# Patient Record
Sex: Male | Born: 1970 | Race: Black or African American | Hispanic: No | Marital: Single | Smoking: Never smoker
Health system: Southern US, Community
[De-identification: ages and names within clinical notes are randomized; demographics above are authoritative.]

## PROBLEM LIST (undated history)

## (undated) DIAGNOSIS — N184 Chronic kidney disease, stage 4 (severe): Secondary | ICD-10-CM

## (undated) DIAGNOSIS — I10 Essential (primary) hypertension: Secondary | ICD-10-CM

## (undated) HISTORY — DX: Essential (primary) hypertension: I10

## (undated) HISTORY — PX: OTHER SURGICAL HISTORY: SHX169

## (undated) HISTORY — DX: Chronic kidney disease, stage 4 (severe): N18.4

---

## 2002-08-14 ENCOUNTER — Emergency Department (HOSPITAL_COMMUNITY): Admission: EM | Admit: 2002-08-14 | Discharge: 2002-08-14 | Payer: Self-pay | Admitting: Emergency Medicine

## 2002-08-14 ENCOUNTER — Encounter: Payer: Self-pay | Admitting: Emergency Medicine

## 2006-04-15 ENCOUNTER — Ambulatory Visit: Payer: Self-pay | Admitting: Nephrology

## 2006-07-16 ENCOUNTER — Emergency Department (HOSPITAL_COMMUNITY): Admission: EM | Admit: 2006-07-16 | Discharge: 2006-07-16 | Payer: Self-pay | Admitting: Emergency Medicine

## 2007-02-03 ENCOUNTER — Emergency Department (HOSPITAL_COMMUNITY): Admission: EM | Admit: 2007-02-03 | Discharge: 2007-02-03 | Payer: Self-pay | Admitting: Family Medicine
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
## 2011-12-02 DIAGNOSIS — E785 Hyperlipidemia, unspecified: Secondary | ICD-10-CM | POA: Insufficient documentation

## 2011-12-10 DIAGNOSIS — N189 Chronic kidney disease, unspecified: Secondary | ICD-10-CM | POA: Insufficient documentation

## 2011-12-10 DIAGNOSIS — N184 Chronic kidney disease, stage 4 (severe): Secondary | ICD-10-CM | POA: Insufficient documentation

## 2012-02-24 DIAGNOSIS — I129 Hypertensive chronic kidney disease with stage 1 through stage 4 chronic kidney disease, or unspecified chronic kidney disease: Secondary | ICD-10-CM | POA: Insufficient documentation

## 2020-05-26 ENCOUNTER — Other Ambulatory Visit (HOSPITAL_COMMUNITY): Payer: Self-pay | Admitting: Nephrology

## 2020-05-26 DIAGNOSIS — I129 Hypertensive chronic kidney disease with stage 1 through stage 4 chronic kidney disease, or unspecified chronic kidney disease: Secondary | ICD-10-CM

## 2020-06-12 ENCOUNTER — Ambulatory Visit (HOSPITAL_COMMUNITY): Admission: RE | Admit: 2020-06-12 | Payer: Self-pay | Source: Ambulatory Visit

## 2020-07-12 ENCOUNTER — Telehealth (HOSPITAL_COMMUNITY): Payer: Self-pay | Admitting: Nephrology

## 2021-09-10 ENCOUNTER — Encounter: Payer: Self-pay | Admitting: Urology

## 2023-05-23 ENCOUNTER — Encounter: Payer: Self-pay | Admitting: Primary Care

## 2023-05-23 ENCOUNTER — Ambulatory Visit (INDEPENDENT_AMBULATORY_CARE_PROVIDER_SITE_OTHER): Payer: BC Managed Care – PPO | Admitting: Primary Care

## 2023-05-23 VITALS — BP 142/78 | HR 70 | Temp 98.3°F | Ht 68.0 in | Wt 238.2 lb

## 2023-05-23 DIAGNOSIS — R0683 Snoring: Secondary | ICD-10-CM

## 2023-05-23 MED ORDER — FAMOTIDINE 20 MG PO TABS: 20.0000 mg | ORAL_TABLET | Freq: Every day | ORAL | 1 refills | Status: DC

## 2023-05-23 NOTE — Progress Notes (Unsigned)
@Patient  ID: Phillip Odonnell, male    DOB: Feb 04, 1971, 52 y.o.   MRN: [id number]  Chief Complaint  Patient presents with   Consult    Loud snoring x several years.  Reflux with spicy foods or lemonade drink.    Referring provider: Randa Lynn, MD  HPI: PMH hypertensive renal disease, leg swelling   05/23/2023 Patient presents today for sleep consult.  He was referred by nephrologist, Dr Phillip Odonnell, due to persistent hypertension. His wife has told him that he snores loudly. Sleep can be disrupted at times. He wakes up a 2-3 times a night on average to use the restroom. He has reflux at night which has caused him to vomit, this happens more specifically when having lemonade or tomato sauce. He has not tried anything for reflux.   Sleep questionnaire Symptoms- loud snoring, sleep disturbance Prior sleep study- none  Bedtime- 10pm Time to fall asleep- not long Nocturnal awakenings- 2-3 times Out of bed/start of day- 3:35am work days; 9am on weekends Weight changes- 10 lbs  Do you operate heavy machinery- yes Do you currently wear CPAP- no Do you current wear oxygen- no Epworth- 8  Not on File   There is no immunization history on file for this patient.  No past medical history on file.  Tobacco History: Social History   Tobacco Use  Smoking Status Never   Passive exposure: Past  Smokeless Tobacco Never   Counseling given: Not Answered   Outpatient Medications Prior to Visit  Medication Sig Dispense Refill   ALLOPURINOL PO Take 1 tablet by mouth daily. For gout     amLODipine (NORVASC) 10 MG tablet Take 10 mg by mouth daily.     atorvastatin (LIPITOR) 20 MG tablet Take 20 mg by mouth daily.     CVS D3 25 MCG (1000 UT) capsule Take 1,000 Units by mouth daily.     ELDERBERRY PO Take by mouth.     furosemide (LASIX) 20 MG tablet Take 20 mg by mouth 2 (two) times daily.     losartan (COZAAR) 100 MG tablet Take 100 mg by mouth daily.     Multiple  Vitamins-Minerals (MULTIVITAMIN WITH MINERALS) tablet Take 1 tablet by mouth daily.     No facility-administered medications prior to visit.   Review of Systems  Review of Systems  Constitutional:  Positive for fatigue.  HENT: Negative.    Respiratory: Negative.    Cardiovascular: Negative.   Psychiatric/Behavioral:  Positive for sleep disturbance.     Physical Exam  BP (!) 142/78 (BP Location: Right Arm, Patient Position: Sitting, Cuff Size: Large)   Pulse 70   Temp 98.3 F (36.8 C) (Oral)   Ht 5\' 8"  (1.727 m)   Wt 238 lb 3.2 oz (108 kg)   SpO2 99%   BMI 36.22 kg/m  Physical Exam Constitutional:      General: He is not in acute distress.    Appearance: Normal appearance. He is not ill-appearing.  HENT:     Head: Normocephalic and atraumatic.  Cardiovascular:     Rate and Rhythm: Normal rate and regular rhythm.  Pulmonary:     Effort: Pulmonary effort is normal.     Breath sounds: Normal breath sounds.  Musculoskeletal:        General: Normal range of motion.  Skin:    General: Skin is warm and dry.  Neurological:     General: No focal deficit present.     Mental Status: He is alert  and oriented to person, place, and time. Mental status is at baseline.  Psychiatric:        Mood and Affect: Mood normal.        Behavior: Behavior normal.        Thought Content: Thought content normal.        Judgment: Judgment normal.      Lab Results:  CBC No results found for: "WBC", "RBC", "HGB", "HCT", "PLT", "MCV", "MCH", "MCHC", "RDW", "LYMPHSABS", "MONOABS", "EOSABS", "BASOSABS"  BMET No results found for: "NA", "K", "CL", "CO2", "GLUCOSE", "BUN", "CREATININE", "CALCIUM", "GFRNONAA", "GFRAA"  BNP No results found for: "BNP"  ProBNP No results found for: "PROBNP"  Imaging: No results found.   Assessment & Plan:   Loud snoring - Past medical history significant for hypertension, referred by nephrologist.  Patient has symptoms of loud snoring and restless  sleep.  He wakes up on average 2-3 times a night.  Epworth 8.  BMI 36.  Concern patient has underlying obstructive sleep apnea, needs home sleep study evaluate.  We discussed risks of untreated sleep apnea including cardiac arrhythmias, pulmonary hypertension, diabetes and stroke.  We also discussed treatment options including weight loss, oral appliance, CPAP therapy referral to ENT for possible surgical options.  Encourage side sleeping position.  Advised against driving if experiencing excessive daytime sleepiness fatigue.  Follow-up 1 to 2 weeks after sleep study to review results and treatment options if needed.   Acid reflux Try wedge pillow to elevate head while sleep to help to cut down on reflux  Follow reflux diet as best a possible  Start famotidine 20mg  at bedtime   Follow-up: 6 weeks to review sleep study results   Glenford Bayley, NP 05/26/2023

## 2023-05-23 NOTE — Patient Instructions (Addendum)
Recommendations: Try wedge pillow to elevate head while sleep to help to cut down on reflux  Follow reflux diet as best a possible  Start famotidine 20mg  at bedtime   Orders: Home sleep study re: loud snoring  Follow-up: 6 weeks to review sleep study results    Food Choices for Gastroesophageal Reflux Disease, Adult When you have gastroesophageal reflux disease (GERD), the foods you eat and your eating habits are very important. Choosing the right foods can help ease your discomfort. Think about working with a food expert (dietitian) to help you make good choices. What are tips for following this plan? Reading food labels Look for foods that are low in saturated fat. Foods that may help with your symptoms include: Foods that have less than 5% of daily value (DV) of fat. Foods that have 0 grams of trans fat. Cooking Do not fry your food. Cook your food by baking, steaming, grilling, or broiling. These are all methods that do not need a lot of fat for cooking. To add flavor, try to use herbs that are low in spice and acidity. Meal planning  Choose healthy foods that are low in fat, such as: Fruits and vegetables. Whole grains. Low-fat dairy products. Lean meats, fish, and poultry. Eat small meals often instead of eating 3 large meals each day. Eat your meals slowly in a place where you are relaxed. Avoid bending over or lying down until 2-3 hours after eating. Limit high-fat foods such as fatty meats or fried foods. Limit your intake of fatty foods, such as oils, butter, and shortening. Avoid the following as told by your doctor: Foods that cause symptoms. These may be different for different people. Keep a food diary to keep track of foods that cause symptoms. Alcohol. Drinking a lot of liquid with meals. Eating meals during the 2-3 hours before bed. Lifestyle Stay at a healthy weight. Ask your doctor what weight is healthy for you. If you need to lose weight, work with your  doctor to do so safely. Exercise for at least 30 minutes on 5 or more days each week, or as told by your doctor. Wear loose-fitting clothes. Do not smoke or use any products that contain nicotine or tobacco. If you need help quitting, ask your doctor. Sleep with the head of your bed higher than your feet. Use a wedge under the mattress or blocks under the bed frame to raise the head of the bed. Chew sugar-free gum after meals. What foods should eat?  Eat a healthy, well-balanced diet of fruits, vegetables, whole grains, low-fat dairy products, lean meats, fish, and poultry. Each person is different. Foods that may cause symptoms in one person may not cause any symptoms in another person. Work with your doctor to find foods that are safe for you. The items listed above may not be a complete list of what you can eat and drink. Contact a food expert for more options. What foods should I avoid? Limiting some of these foods may help in managing the symptoms of GERD. Everyone is different. Talk with a food expert or your doctor to help you find the exact foods to avoid, if any. Fruits Any fruits prepared with added fat. Any fruits that cause symptoms. For some people, this may include citrus fruits, such as oranges, grapefruit, pineapple, and lemons. Vegetables Deep-fried vegetables. [place] fries. Any vegetables prepared with added fat. Any vegetables that cause symptoms. For some people, this may include tomatoes and tomato products, chili peppers, onions  and garlic, and horseradish. Grains Pastries or quick breads with added fat. Meats and other proteins High-fat meats, such as fatty beef or pork, hot dogs, ribs, ham, sausage, salami, and bacon. Fried meat or protein, including fried fish and fried chicken. Nuts and nut butters, in large amounts. Dairy Whole milk and chocolate milk. Sour cream. Cream. Ice cream. Cream cheese. Milkshakes. Fats and oils Butter. Margarine. Shortening.  Ghee. Beverages Coffee and tea, with or without caffeine. Carbonated beverages. Sodas. Energy drinks. Fruit juice made with acidic fruits, such as orange or grapefruit. Tomato juice. Alcoholic drinks. Sweets and desserts Chocolate and cocoa. Donuts. Seasonings and condiments Pepper. Peppermint and spearmint. Added salt. Any condiments, herbs, or seasonings that cause symptoms. For some people, this may include curry, hot sauce, or vinegar-based salad dressings. The items listed above may not be a complete list of what you should not eat and drink. Contact a food expert for more options. Questions to ask your doctor Diet and lifestyle changes are often the first steps that are taken to manage symptoms of GERD. If diet and lifestyle changes do not help, talk with your doctor about taking medicines. Where to find more information International Foundation for Gastrointestinal Disorders: aboutgerd.org Summary When you have GERD, food and lifestyle choices are very important in easing your symptoms. Eat small meals often instead of 3 large meals a day. Eat your meals slowly and in a place where you are relaxed. Avoid bending over or lying down until 2-3 hours after eating. Limit high-fat foods such as fatty meats or fried foods. This information is not intended to replace advice given to you by your health care provider. Make sure you discuss any questions you have with your health care provider. Document Revised: 05/22/2020 Document Reviewed: 05/22/2020 Elsevier Patient Education  2024 [employer].

## 2023-05-26 DIAGNOSIS — R0683 Snoring: Secondary | ICD-10-CM | POA: Insufficient documentation

## 2023-05-26 DIAGNOSIS — K219 Gastro-esophageal reflux disease without esophagitis: Secondary | ICD-10-CM | POA: Insufficient documentation

## 2023-05-26 NOTE — Assessment & Plan Note (Signed)
-   Past medical history significant for hypertension, referred by nephrologist.  Patient has symptoms of loud snoring and restless sleep.  He wakes up on average 2-3 times a night.  Epworth 8.  BMI 36.  Concern patient has underlying obstructive sleep apnea, needs home sleep study evaluate.  We discussed risks of untreated sleep apnea including cardiac arrhythmias, pulmonary hypertension, diabetes and stroke.  We also discussed treatment options including weight loss, oral appliance, CPAP therapy referral to ENT for possible surgical options.  Encourage side sleeping position.  Advised against driving if experiencing excessive daytime sleepiness fatigue.  Follow-up 1 to 2 weeks after sleep study to review results and treatment options if needed.

## 2023-05-26 NOTE — Assessment & Plan Note (Signed)
Try wedge pillow to elevate head while sleep to help to cut down on reflux  Follow reflux diet as best a possible  Start famotidine 20mg  at bedtime

## 2023-09-04 LAB — LAB REPORT - SCANNED
Creatinine, POC: 71.4 mg/dL
EGFR: 20

## 2023-11-05 ENCOUNTER — Encounter: Payer: BC Managed Care – PPO | Attending: Physician Assistant | Admitting: Nutrition

## 2023-11-05 VITALS — Ht 67.0 in | Wt 231.0 lb

## 2023-11-05 DIAGNOSIS — I129 Hypertensive chronic kidney disease with stage 1 through stage 4 chronic kidney disease, or unspecified chronic kidney disease: Secondary | ICD-10-CM | POA: Insufficient documentation

## 2023-11-05 DIAGNOSIS — E669 Obesity, unspecified: Secondary | ICD-10-CM | POA: Diagnosis present

## 2023-11-05 DIAGNOSIS — E785 Hyperlipidemia, unspecified: Secondary | ICD-10-CM | POA: Diagnosis present

## 2023-11-05 DIAGNOSIS — N184 Chronic kidney disease, stage 4 (severe): Secondary | ICD-10-CM | POA: Insufficient documentation

## 2023-11-05 NOTE — Patient Instructions (Signed)
Goals  Increase fruits, vegetables and whole plant based foods Cut out blueberry muffins and eat oatmeal instead Talk to MD about smart classes Go to [employer]

## 2023-11-05 NOTE — Progress Notes (Unsigned)
Medical Nutrition Therapy  Appointment Start time:  1430  Appointment End time:  1600 Primary concerns today: CKD  Referral diagnosis: N18.4 Preferred learning style: No Preference  Learning readiness: Ready    NUTRITION ASSESSMENT  52 yr old bmale referred for CKD Stg 4. " I wish someone would have referred me to a dietitian 20 years ago." Here is with his wife.  PCP Renaldo Reel, PA [place], CKD MD Dr. Wolfgang Phoenix Diet does have some processed foods in it. He and his wife are willing to work on following more of a kidney friendly diet with low sodium and more whole plant based foods. He went to the SMART class but no provider showed up for it. Will try to get in another class from the dialysis center. Discussed and reviewed [employer] and resources for kidney dz, diet  and healthy foods to fix, medications and treatments.  He is willing to work on a low sodium whole plant base diet to improve his kidney function as best as possible.   Clinical history CKD stg 4, HTN and Hyperlipidemia, GOUT,   Medications:  Current Outpatient Medications on File Prior to Visit  Medication Sig Dispense Refill   ALLOPURINOL PO Take 1 tablet by mouth daily. For gout     amLODipine (NORVASC) 10 MG tablet Take 10 mg by mouth daily.     atorvastatin (LIPITOR) 20 MG tablet Take 20 mg by mouth daily.     CVS D3 25 MCG (1000 UT) capsule Take 1,000 Units by mouth daily.     ELDERBERRY PO Take by mouth.     famotidine (PEPCID) 20 MG tablet Take 1 tablet (20 mg total) by mouth at bedtime. 30 tablet 1   furosemide (LASIX) 20 MG tablet Take 20 mg by mouth 2 (two) times daily.     losartan (COZAAR) 100 MG tablet Take 50 mg by mouth daily.     Multiple Vitamins-Minerals (MULTIVITAMIN WITH MINERALS) tablet Take 1 tablet by mouth daily.     No current facility-administered medications on file prior to visit.    Labs: 11/24) eGFR 24, Creat 2.99   Notable Signs/Symptoms: Lack of energy, leg pains, muscle  aches, stays cold  Lifestyle & Dietary Hx Lives with his wife. Works [employer]  Estimated daily fluid intake: 40-60 oz Supplements: MVI-- needs to get the one for kidneys Sleep:  Stress / self-care: his  CKD Current average weekly physical activity: Walks at work  24-Hr Dietary Recall First Meal: Blueberry muffin 1, with blueberries or banana, water Snack: Bluberry muffin,  Second Meal: [place] sandwich- honey wheat, yogurt,  water Snack:  Third Meal: 1 Salmon cake, mashed potatoes, water Snack: water , occassional chips Beverages: water  Estimated Energy Needs Calories: 1800 Carbohydrate: 200g Protein: 135g Fat: 50g   NUTRITION DIAGNOSIS  NB-1.1 Food and nutrition-related knowledge deficit As related to CKD.  As evidenced by Stg 4, eGFR 24, Creat 1.99.   NUTRITION INTERVENTION  Nutrition education (E-1) on the following topics:  Diet for Stg 4 CKD-low sodium whole plant based Reading food labels Using [employer] website for information and education.   Handouts Provided Include  CKD Nutrition Therapy Stg 3-5   Learning Style & Readiness for Change Teaching method utilized: Visual & Auditory  Demonstrated degree of understanding via: Teach Back  Barriers to learning/adherence to lifestyle change: none  Goals Established by Pt ***   MONITORING & EVALUATION Dietary intake, weekly physical activity, and *** in ***.  Next Steps  Patient is  to ***.

## 2023-12-09 ENCOUNTER — Encounter: Payer: Self-pay | Admitting: Nutrition

## 2023-12-22 ENCOUNTER — Encounter: Payer: BC Managed Care – PPO | Attending: Physician Assistant | Admitting: Nutrition

## 2023-12-22 VITALS — Ht 67.0 in | Wt 232.0 lb

## 2023-12-22 DIAGNOSIS — E669 Obesity, unspecified: Secondary | ICD-10-CM | POA: Diagnosis present

## 2023-12-22 DIAGNOSIS — N184 Chronic kidney disease, stage 4 (severe): Secondary | ICD-10-CM | POA: Insufficient documentation

## 2023-12-22 DIAGNOSIS — E785 Hyperlipidemia, unspecified: Secondary | ICD-10-CM | POA: Insufficient documentation

## 2023-12-22 DIAGNOSIS — I129 Hypertensive chronic kidney disease with stage 1 through stage 4 chronic kidney disease, or unspecified chronic kidney disease: Secondary | ICD-10-CM | POA: Insufficient documentation

## 2023-12-22 NOTE — Patient Instructions (Signed)
Goals  Walk for 30 minutes or longer on Saturday and Sunday and once a week. Avoid foods high in phosphorous Increase water intake and cut out lemonade Get A1C to 5.7% Eat more foods from garden.

## 2023-12-22 NOTE — Progress Notes (Signed)
Medical Nutrition Therapy  Appointment Start time:  1540  Appointment End time:  1615 Primary concerns today: CKD  Referral diagnosis: N18.4 Preferred learning style: No Preference  Learning readiness: Ready    NUTRITION ASSESSMENT  Has been working on cutting out processed foods. Is drinking more water DIdn't go to the SMART class. Hasn't gone to check out the [facility] website yet. CKD MD is Dr. Wolfgang Phoenix. No changes per last visit.  He notes he is eating more vegetables and fruit. Trying to avoid foods high in salt. Hoping he can get on the transplant list. Takes a MVI but not one for kidneys. Will check again into that.  He is willing to work on a low sodium whole plant base diet to improve his kidney function as best as possible.   Clinical history CKD stg 4, HTN and Hyperlipidemia, GOUT,   Wt Readings from Last 3 Encounters:  12/22/23 232 lb (105.2 kg)  11/05/23 231 lb (104.8 kg)  05/23/23 238 lb 3.2 oz (108 kg)   Ht Readings from Last 3 Encounters:  12/22/23 5\' 7"  (1.702 m)  11/05/23 5\' 7"  (1.702 m)  05/23/23 5\' 8"  (1.727 m)   Body mass index is 36.34 kg/m. @BMIFA @ Facility age limit for growth %iles is 20 years. Facility age limit for growth %iles is 20 years.  Medications:  Current Outpatient Medications on File Prior to Visit  Medication Sig Dispense Refill   ALLOPURINOL PO Take 1 tablet by mouth daily. For gout     amLODipine (NORVASC) 10 MG tablet Take 10 mg by mouth daily.     atorvastatin (LIPITOR) 20 MG tablet Take 20 mg by mouth daily.     CVS D3 25 MCG (1000 UT) capsule Take 1,000 Units by mouth daily.     ELDERBERRY PO Take by mouth.     famotidine (PEPCID) 20 MG tablet Take 1 tablet (20 mg total) by mouth at bedtime. 30 tablet 1   furosemide (LASIX) 20 MG tablet Take 20 mg by mouth 2 (two) times daily.     losartan (COZAAR) 100 MG tablet Take 50 mg by mouth daily.     Multiple Vitamins-Minerals (MULTIVITAMIN WITH MINERALS) tablet Take 1 tablet by mouth  daily.     No current facility-administered medications on file prior to visit.    Labs: 11/24) eGFR 24, Creat 2.99   Notable Signs/Symptoms: Lack of energy, leg pains, muscle aches, stays cold  Lifestyle & Dietary Hx Lives with his wife. Works [employer]  Estimated daily fluid intake: 40-60 oz Supplements: MVI-- needs to get the one for kidneys Sleep:  Stress / self-care: his  CKD Current average weekly physical activity: Walks at work  24-Hr Dietary Recall First Meal:skipped Lunch Chicken salad homemade on honey wheat bread,  chips, water Dinner:Hamburger with bun, cheese, bacon  from [place], Lemonade.   Estimated Energy Needs Calories: 1800 Carbohydrate: 200g Protein: 135g Fat: 50g   NUTRITION DIAGNOSIS  NB-1.1 Food and nutrition-related knowledge deficit As related to CKD.  As evidenced by Stg 4, eGFR 24, Creat 1.99.   NUTRITION INTERVENTION  Nutrition education (E-1) on the following topics:  Diet for Stg 4 CKD-low sodium whole plant based Reading food labels Using [employer] website for information and education.   Handouts Provided Include  CKD Nutrition Therapy Stg 3-5   Learning Style & Readiness for Change Teaching method utilized: Visual & Auditory  Demonstrated degree of understanding via: Teach Back  Barriers to learning/adherence to lifestyle change: none  Goals Established  by Pt Goals Goals  Walk for 30 minutes or longer on Saturday and Sunday and once a week. Avoid foods high in phosphorous Increase water intake and cut out lemonade Get A1C to 5.7% Eat more foods from garden.  MONITORING & EVALUATION Dietary intake, weekly physical activity, and weight in 3 month.  Next Steps  Patient is to work on following kidney guidelines.

## 2023-12-29 ENCOUNTER — Encounter: Payer: Self-pay | Admitting: Nutrition

## 2024-03-22 ENCOUNTER — Ambulatory Visit: Payer: BC Managed Care – PPO | Admitting: Nutrition

## 2024-09-27 ENCOUNTER — Encounter (INDEPENDENT_AMBULATORY_CARE_PROVIDER_SITE_OTHER): Payer: Self-pay | Admitting: *Deleted

## 2024-10-01 NOTE — Progress Notes (Signed)
 Phillip Odonnell  [address] [place] [address]   [phone]         Orders Placed This Encounter  Procedures  . Ambulatory referral to Solid Organ Transplant Team

## 2024-10-07 NOTE — Progress Notes (Signed)
 Referral received. Care Everywhere initiated. Records reviewed. Pt is okay for education.  ESRD 2/2 FSGS  Pt has hx of DLD, HTN, gout, prediabetes, sleep apnea  BMI: 37  Colonoscopy: 11/06/20  NOD-Dr. Rachele

## 2024-10-08 ENCOUNTER — Inpatient Hospital Stay: Attending: Hematology

## 2024-10-08 ENCOUNTER — Encounter: Payer: Self-pay | Admitting: Hematology

## 2024-10-08 ENCOUNTER — Inpatient Hospital Stay: Admitting: Hematology

## 2024-10-08 VITALS — BP 157/83 | HR 81 | Temp 98.5°F | Resp 16 | Wt 234.3 lb

## 2024-10-08 DIAGNOSIS — D509 Iron deficiency anemia, unspecified: Secondary | ICD-10-CM | POA: Insufficient documentation

## 2024-10-08 DIAGNOSIS — D631 Anemia in chronic kidney disease: Secondary | ICD-10-CM | POA: Insufficient documentation

## 2024-10-08 DIAGNOSIS — I129 Hypertensive chronic kidney disease with stage 1 through stage 4 chronic kidney disease, or unspecified chronic kidney disease: Secondary | ICD-10-CM | POA: Insufficient documentation

## 2024-10-08 DIAGNOSIS — N184 Chronic kidney disease, stage 4 (severe): Secondary | ICD-10-CM

## 2024-10-08 DIAGNOSIS — D649 Anemia, unspecified: Secondary | ICD-10-CM

## 2024-10-08 LAB — RENAL FUNCTION PANEL
Albumin: 4.6 g/dL (ref 3.5–5.0)
Anion gap: 15 (ref 5–15)
BUN: 70 mg/dL — ABNORMAL HIGH (ref 6–20)
CO2: 22 mmol/L (ref 22–32)
Calcium: 9.6 mg/dL (ref 8.9–10.3)
Chloride: 98 mmol/L (ref 98–111)
Creatinine, Ser: 5.41 mg/dL — ABNORMAL HIGH (ref 0.61–1.24)
GFR, Estimated: 12 mL/min — ABNORMAL LOW (ref >60)
Glucose, Bld: 101 mg/dL — ABNORMAL HIGH (ref 70–99)
Phosphorus: 5.2 mg/dL — ABNORMAL HIGH (ref 2.5–4.6)
Potassium: 4.4 mmol/L (ref 3.5–5.1)
Sodium: 136 mmol/L (ref 135–145)

## 2024-10-08 LAB — IRON AND TIBC
Iron: 43 ug/dL — ABNORMAL LOW (ref 45–182)
Saturation Ratios: 16 % — ABNORMAL LOW (ref 17.9–39.5)
TIBC: 267 ug/dL (ref 250–450)
UIBC: 224 ug/dL

## 2024-10-08 LAB — CBC WITH DIFFERENTIAL/PLATELET
Abs Immature Granulocytes: 0.06 K/uL (ref 0.00–0.07)
Basophils Absolute: 0.1 K/uL (ref 0.0–0.1)
Basophils Relative: 0 %
Eosinophils Absolute: 0.3 K/uL (ref 0.0–0.5)
Eosinophils Relative: 2 %
HCT: 38.9 % — ABNORMAL LOW (ref 39.0–52.0)
Hemoglobin: 12.5 g/dL — ABNORMAL LOW (ref 13.0–17.0)
Immature Granulocytes: 1 %
Lymphocytes Relative: 24 %
Lymphs Abs: 3 K/uL (ref 0.7–4.0)
MCH: 28.1 pg (ref 26.0–34.0)
MCHC: 32.1 g/dL (ref 30.0–36.0)
MCV: 87.4 fL (ref 80.0–100.0)
Monocytes Absolute: 0.9 K/uL (ref 0.1–1.0)
Monocytes Relative: 7 %
Neutro Abs: 8.5 K/uL — ABNORMAL HIGH (ref 1.7–7.7)
Neutrophils Relative %: 66 %
Platelets: 259 K/uL (ref 150–400)
RBC: 4.45 MIL/uL (ref 4.22–5.81)
RDW: 12.6 % (ref 11.5–15.5)
WBC: 12.8 K/uL — ABNORMAL HIGH (ref 4.0–10.5)
nRBC: 0 % (ref 0.0–0.2)

## 2024-10-08 LAB — RETIC PANEL
Immature Retic Fract: 10.5 % (ref 2.3–15.9)
RBC.: 4.42 MIL/uL (ref 4.22–5.81)
Retic Count, Absolute: 64.1 K/uL (ref 19.0–186.0)
Retic Ct Pct: 1.5 % (ref 0.4–3.1)
Reticulocyte Hemoglobin: 32.6 pg (ref >27.9)

## 2024-10-08 LAB — FOLATE: Folate: 14.3 ng/mL (ref >5.9)

## 2024-10-08 LAB — FERRITIN: Ferritin: 243 ng/mL (ref 24–336)

## 2024-10-08 LAB — VITAMIN B12: Vitamin B-12: 3338 pg/mL — ABNORMAL HIGH (ref 180–914)

## 2024-10-08 NOTE — Progress Notes (Addendum)
 [facility]   [phone]) [phone] [phone]) [phone]   Clinic New Consult Note   Patient Care Team: Jolee Elsie RAMAN, [place] as PCP - General (Physician Assistant) Rachele Gaynell RAMAN, MD as Consulting Physician (Nephrology) 10/08/2024  CHIEF COMPLAINTS/PURPOSE OF CONSULTATION:  Iron deficient anemia   REFERRING PHYSICIAN: Delsie Riggs, NP   Discussed the use of AI scribe software for clinical note transcription with the patient, who gave verbal consent to proceed.  History of Present Illness Phillip Odonnell is a 53 year old male with stage IV chronic kidney disease who presents with anemia. He was referred by his nephrologist NP Tiffany for evaluation of anemia.  He has chronic kidney disease for 20 to 30 years, probable caused by hypertension.  His recent lab 2 weeks ago showed eGFR 15, he has been referred for kidney transplant evaluation.  He has not required dialysis yet.  Routine lab in 2 weeks ago showed slightly low iron saturation and TIBC, serum iron level was normal, he was started on oral iron ferrous sulfate once a day 2 weeks ago.  I do not see outside labs CBC and ferritin results.  Patient feels well overall, still works full-time, mild fatigue, function normally.  He reports intermittent muscle cramps, which has slightly improved with oral magnesium and iron.  Denies any bleeding, no hematochezia or hematemesis.  Colonoscopy 5 years ago.     MEDICAL HISTORY:  Past Medical History:  Diagnosis Date   CKD (chronic kidney disease) stage 4, GFR 15-29 ml/min (HCC)    HTN (hypertension)     SURGICAL HISTORY: History reviewed. No pertinent surgical history.  SOCIAL HISTORY: Social History   Socioeconomic History   Marital status: Single    Spouse name: Not on file   Number of children: 0   Years of education: Not on file   Highest education level: Not on file  Occupational History   Not on file  Tobacco Use   Smoking status: Never    Passive  exposure: Past   Smokeless tobacco: Never  Substance and Sexual Activity   Alcohol use: Not Currently   Drug use: Not on file   Sexual activity: Not on file  Other Topics Concern   Not on file  Social History Narrative   Not on file   Social Drivers of Health   Financial Resource Strain: Not on file  Food Insecurity: Not on file  Transportation Needs: Not on file  Physical Activity: Not on file  Stress: Not on file  Social Connections: Not on file  Intimate Partner Violence: Not on file    FAMILY HISTORY: History reviewed. No pertinent family history.  ALLERGIES:  has no allergies on file.  MEDICATIONS:  Current Outpatient Medications  Medication Sig Dispense Refill   allopurinol (ZYLOPRIM) 100 MG tablet Take 200 mg by mouth daily.     amLODipine (NORVASC) 10 MG tablet Take 10 mg by mouth daily.     [employer] D3 25 MCG (1000 UT) capsule Take 1,000 Units by mouth daily.     ferrous sulfate 324 (65 Fe) MG TBEC Take 1 tablet by mouth daily.     furosemide (LASIX) 20 MG tablet Take 20 mg by mouth 2 (two) times daily.     magnesium oxide (MAG-OX) 400 MG tablet Take 1 tablet by mouth daily.     spironolactone (ALDACTONE) 25 MG tablet Take 25 mg by mouth daily.     atorvastatin (LIPITOR) 20 MG tablet Take 20 mg by mouth  daily. (Patient not taking: Reported on 10/08/2024)     ELDERBERRY PO Take by mouth. (Patient not taking: Reported on 10/08/2024)     famotidine  (PEPCID ) 20 MG tablet Take 1 tablet (20 mg total) by mouth at bedtime. (Patient not taking: Reported on 10/08/2024) 30 tablet 1   Multiple Vitamins-Minerals (MULTIVITAMIN WITH MINERALS) tablet Take 1 tablet by mouth daily. (Patient not taking: Reported on 10/08/2024)     No current facility-administered medications for this visit.    REVIEW OF SYSTEMS:   Constitutional: Denies fevers, chills or abnormal night sweats Eyes: Denies blurriness of vision, double vision or watery eyes Ears, nose, mouth, throat, and face: Denies  mucositis or sore throat Respiratory: Denies cough, dyspnea or wheezes Cardiovascular: Denies palpitation, chest discomfort or lower extremity swelling Gastrointestinal:  Denies nausea, heartburn or change in bowel habits Skin: Denies abnormal skin rashes Lymphatics: Denies new lymphadenopathy or easy bruising Neurological:Denies numbness, tingling or new weaknesses Behavioral/Psych: Mood is stable, no new changes  All other systems were reviewed with the patient and are negative.  PHYSICAL EXAMINATION: ECOG PERFORMANCE STATUS: 0 - Asymptomatic  Vitals:   10/08/24 1509  BP: (!) 157/83  Pulse: 81  Resp: 16  Temp: 98.5 F (36.9 C)  SpO2: 97%   Filed Weights   10/08/24 1509  Weight: 234 lb 5.6 oz (106.3 kg)    GENERAL:alert, no distress and comfortable SKIN: skin color, texture, turgor are normal, no rashes or significant lesions EYES: normal, conjunctiva are pink and non-injected, sclera clear OROPHARYNX:no exudate, no erythema and lips, buccal mucosa, and tongue normal  NECK: supple, thyroid normal size, non-tender, without nodularity LYMPH:  no palpable lymphadenopathy in the cervical, axillary or inguinal LUNGS: clear to auscultation and percussion with normal breathing effort HEART: regular rate & rhythm and no murmurs and no lower extremity edema ABDOMEN:abdomen soft, non-tender and normal bowel sounds Musculoskeletal:no cyanosis of digits and no clubbing  PSYCH: alert & oriented x 3 with fluent speech NEURO: no focal motor/sensory deficits  Physical Exam   LABORATORY DATA:   I have reviewed his outside lab from 2 weeks ago.  RADIOGRAPHIC STUDIES: I have personally reviewed the radiological images as listed and agreed with the findings in the report. No results found.    Assessment & Plan Anemia of chronic disease, secondary to CKD, with mild iron deficient anemia. Chronic anemia likely secondary to long-standing kidney disease, possibly related to  hypertension.  -Outside lab in 2 weeks ago showed slightly reduced TIBC and iron saturation, I do not see a CBC and ferritin level. - Will check CBC, ferritin, TIBC, reticulocyte count, and B12. - Given his longstanding CKD over 20 years, this is a very unlikely multiple myeloma, I do not see the need for lab work to rule out multiple myeloma. - I will call him with lab results - Encouraged him to continue oral iron, which he started 2 weeks ago.  If he does have moderate low iron will repeat CBC and iron level in 3 months, to see if he needs IV iron.  CKD stage IV - Follow-up with PCP  Plan - Labs today - Continue oral iron - Call him next week, will determine if he needs IV iron based on his lab results.   Orders Placed This Encounter  Procedures   CBC with Differential/Platelet    Standing Status:   Future    Number of Occurrences:   1    Expected Date:   10/08/2024    Expiration Date:  01/06/2025   Ferritin    Standing Status:   Future    Number of Occurrences:   1    Expected Date:   10/08/2024    Expiration Date:   01/06/2025   Iron and TIBC ([facility] ONLY)    Standing Status:   Future    Number of Occurrences:   1    Expected Date:   10/08/2024    Expiration Date:   01/06/2025   Retic Panel    Standing Status:   Future    Number of Occurrences:   1    Expected Date:   10/08/2024    Expiration Date:   01/06/2025   Vitamin B12    Standing Status:   Future    Number of Occurrences:   1    Expected Date:   10/08/2024    Expiration Date:   01/06/2025   Folate, Serum    Standing Status:   Future    Number of Occurrences:   1    Expected Date:   10/08/2024    Expiration Date:   01/06/2025   Renal function panel    Standing Status:   Future    Number of Occurrences:   1    Expected Date:   10/08/2024    Expiration Date:   10/08/2025    All questions were answered. The patient knows to call the clinic with any problems, questions or concerns. I  spent 25 minutes counseling the patient face to face. The total time spent in the appointment was 30 minutes including review of chart and various tests results, discussions about plan of care and coordination of care plan.     Onita Mattock, MD 10/08/2024 3:41 PM  Addendum I called patient and reviewed his lab results.  His hemoglobin was 12.5, ferritin 243, iron saturation was slightly low, B12 was elevated.  Folate was normal.  Given the very mild anemia and normal ferritin, I do not recommend IV iron, I encouraged him to continue oral iron.  He takes B12 occasionally, I recommend him to stop.  We will see him as needed in future, he will continue follow-up with his PCP and nephrologist for lab monitoring.  He voiced good understanding and agrees with the plan.  Onita Mattock  10/19/2024

## 2024-10-18 ENCOUNTER — Other Ambulatory Visit: Payer: Self-pay

## 2024-10-18 DIAGNOSIS — N186 End stage renal disease: Secondary | ICD-10-CM

## 2024-10-26 ENCOUNTER — Ambulatory Visit (INDEPENDENT_AMBULATORY_CARE_PROVIDER_SITE_OTHER): Admitting: Gastroenterology

## 2024-10-28 ENCOUNTER — Encounter (INDEPENDENT_AMBULATORY_CARE_PROVIDER_SITE_OTHER): Payer: Self-pay | Admitting: *Deleted

## 2024-11-16 ENCOUNTER — Encounter: Payer: Self-pay | Admitting: Vascular Surgery

## 2024-11-16 ENCOUNTER — Ambulatory Visit: Admitting: Vascular Surgery

## 2024-11-16 ENCOUNTER — Ambulatory Visit (HOSPITAL_BASED_OUTPATIENT_CLINIC_OR_DEPARTMENT_OTHER)
Admission: RE | Admit: 2024-11-16 | Discharge: 2024-11-16 | Disposition: A | Discharge status: Home / Self Care | Source: Ambulatory Visit | Attending: Vascular Surgery | Admitting: Vascular Surgery

## 2024-11-16 ENCOUNTER — Ambulatory Visit (HOSPITAL_COMMUNITY)
Admission: RE | Admit: 2024-11-16 | Discharge: 2024-11-16 | Disposition: A | Discharge status: Home / Self Care | Source: Ambulatory Visit | Attending: Vascular Surgery | Admitting: Vascular Surgery

## 2024-11-16 VITALS — BP 140/80 | HR 60 | Temp 97.8°F | Resp 18 | Ht 67.0 in | Wt 228.6 lb

## 2024-11-16 DIAGNOSIS — N186 End stage renal disease: Secondary | ICD-10-CM | POA: Diagnosis not present

## 2024-11-16 DIAGNOSIS — N184 Chronic kidney disease, stage 4 (severe): Secondary | ICD-10-CM | POA: Insufficient documentation

## 2024-11-16 NOTE — Progress Notes (Signed)
 "   Patient name: Phillip Odonnell MRN: [id number] DOB: Sep 05, 1971 Sex: male  REASON FOR CONSULT: Permanent hemodialysis access  HPI: Phillip Odonnell is a 53 y.o. male, with history of hypertension and CKD 4 now with GFR near 29 with declining kidney function that presents for evaluation of permanent dialysis access.  Patient is right-handed.  Not on dialysis at this time.  States they are considering their options and also looking at PD dialysis.  Has had family on dialysis in the past.  Past Medical History:  Diagnosis Date   CKD (chronic kidney disease) stage 4, GFR 15-29 ml/min (HCC)    HTN (hypertension)     Past Surgical History:  Procedure Laterality Date   Biopsy of kidney      History reviewed. No pertinent family history.  SOCIAL HISTORY: Social History   Socioeconomic History   Marital status: Single    Spouse name: Not on file   Number of children: 0   Years of education: Not on file   Highest education level: Not on file  Occupational History   Not on file  Tobacco Use   Smoking status: Never    Passive exposure: Past   Smokeless tobacco: Never  Vaping Use   Vaping status: Never Used  Substance and Sexual Activity   Alcohol use: Not Currently   Drug use: Never   Sexual activity: Not on file  Other Topics Concern   Not on file  Social History Narrative   Not on file   Social Drivers of Health   Tobacco Use: Low Risk (11/16/2024)   Patient History    Smoking Tobacco Use: Never    Smokeless Tobacco Use: Never    Passive Exposure: Past  Financial Resource Strain: Low Risk (10/20/2024)   Received from [facility]   Overall Financial Resource Strain (CARDIA)    How hard is it for you to pay for the very basics like food, housing, medical care, and heating?: Not very hard  Food Insecurity: No Food Insecurity (10/20/2024)   Received from [facility]   Epic    Within the past 12 months, you worried that your food would run out before you got the  money to buy more.: Never true    Within the past 12 months, the food you bought just didn't last and you didn't have money to get more.: Never true  Transportation Needs: No Transportation Needs (10/20/2024)   Received from [facility]    In the past 12 months, has lack of transportation kept you from medical appointments or from getting medications?: No    In the past 12 months, has lack of transportation kept you from meetings, work, or from getting things needed for daily living?: No  Physical Activity: Not on file  Stress: Not on file  Social Connections: Not on file  Intimate Partner Violence: Not on file  Depression (PHQ2-9): Low Risk (10/08/2024)   Depression (PHQ2-9)    PHQ-2 Score: 0  Alcohol Screen: Not on file  Housing: Low Risk (10/20/2024)   Received from [facility]    In the last 12 months, was there a time when you were not able to pay the mortgage or rent on time?: No    In the past 12 months, how many times have you moved where you were living?: 0    At any time in the past 12 months, were you homeless or living in a shelter (including now)?:  No  Utilities: Not At Risk (10/20/2024)   Received from [facility]    In the past 12 months has the electric, gas, oil, or water company threatened to shut off services in your home?: No  Health Literacy: Not on file    Allergies[1]  Current Outpatient Medications  Medication Sig Dispense Refill   allopurinol (ZYLOPRIM) 100 MG tablet Take 200 mg by mouth daily.     amLODipine (NORVASC) 10 MG tablet Take 10 mg by mouth daily.     CVS D3 25 MCG (1000 UT) capsule Take 1,000 Units by mouth daily.     ferrous sulfate 324 (65 Fe) MG TBEC Take 1 tablet by mouth daily.     furosemide (LASIX) 20 MG tablet Take 20 mg by mouth 2 (two) times daily.     magnesium oxide (MAG-OX) 400 MG tablet Take 1 tablet by mouth daily.     spironolactone (ALDACTONE) 25 MG tablet Take 25 mg by mouth daily.     No current  facility-administered medications for this visit.    REVIEW OF SYSTEMS:  [X]  denotes positive finding, [ ]  denotes negative finding Cardiac  Comments:  Chest pain or chest pressure:    Shortness of breath upon exertion:    Short of breath when lying flat:    Irregular heart rhythm:        Vascular    Pain in calf, thigh, or hip brought on by ambulation:    Pain in feet at night that wakes you up from your sleep:     Blood clot in your veins:    Leg swelling:         Pulmonary    Oxygen at home:    Productive cough:     Wheezing:         Neurologic    Sudden weakness in arms or legs:     Sudden numbness in arms or legs:     Sudden onset of difficulty speaking or slurred speech:    Temporary loss of vision in one eye:     Problems with dizziness:         Gastrointestinal    Blood in stool:     Vomited blood:         Genitourinary    Burning when urinating:     Blood in urine:        Psychiatric    Major depression:         Hematologic    Bleeding problems:    Problems with blood clotting too easily:        Skin    Rashes or ulcers:        Constitutional    Fever or chills:      PHYSICAL EXAM: Vitals:   11/16/24 1526  BP: (!) 140/80  Pulse: 60  Resp: 18  Temp: 97.8 F (36.6 C)  TempSrc: Temporal  SpO2: 99%  Weight: 228 lb 9.6 oz (103.7 kg)  Height: 5' 7 (1.702 m)    GENERAL: The patient is a well-nourished male, in no acute distress. The vital signs are documented above. CARDIAC: There is a regular rate and rhythm.  VASCULAR:  Bilateral brachial radial pulses palpable No chest wall implants PULMONARY: No respiratory distress. ABDOMEN: Soft and non-tender. MUSCULOSKELETAL: There are no major deformities or cyanosis. NEUROLOGIC: No focal weakness or paresthesias are detected. SKIN: There are no ulcers or rashes noted. PSYCHIATRIC: The patient has a normal affect.  DATA:  UPPER EXTREMITY VEIN MAPPING   Patient Name:  Phillip Odonnell  Date  of Exam:   11/16/2024  Medical Rec #: [id number]         Accession #:    [id number]  Date of Birth: 1971/10/31         Patient Gender: M  Patient Age:   53 years  Exam Location:  [address]  Procedure:      VAS US  UPPER EXT VEIN MAPPING (PRE-OP  AVF)  Referring Phys: LONNI GASKINS    ---------------------------------------------------------------------------  -----    Indications: Pre-access.   Performing Technologist: Devere Dark RVT     Examination Guidelines: A complete evaluation includes B-mode imaging,  spectral  Doppler, color Doppler, and power Doppler as needed of all accessible  portions  of each vessel. Bilateral testing is considered an integral part of a  complete  examination. Limited examinations for reoccurring indications may be  performed  as noted.   +-----------------+-------------+----------+--------------+  Right Cephalic   Diameter (cm)Depth (cm)   Findings     +-----------------+-------------+----------+--------------+  Shoulder                               not visualized  +-----------------+-------------+----------+--------------+  Prox upper arm                          not visualized  +-----------------+-------------+----------+--------------+  Mid upper arm        0.30                  lateral      +-----------------+-------------+----------+--------------+  Dist upper arm       0.27                               +-----------------+-------------+----------+--------------+  Antecubital fossa    0.35                               +-----------------+-------------+----------+--------------+  Prox forearm         0.31                               +-----------------+-------------+----------+--------------+  Mid forearm                               too small     +-----------------+-------------+----------+--------------+  Dist forearm         0.18                                +-----------------+-------------+----------+--------------+  Wrist               0.18                               +-----------------+-------------+----------+--------------+   +-----------------+-------------+----------+--------------+  Right Basilic    Diameter (cm)Depth (cm)   Findings     +-----------------+-------------+----------+--------------+  Shoulder                               not visualized  +-----------------+-------------+----------+--------------+  Prox  upper arm                   0.57     branching     +-----------------+-------------+----------+--------------+  Mid upper arm                    0.46                   +-----------------+-------------+----------+--------------+  Dist upper arm                   0.46                   +-----------------+-------------+----------+--------------+  Antecubital fossa                0.45                   +-----------------+-------------+----------+--------------+  Prox forearm                     0.37                   +-----------------+-------------+----------+--------------+   +-----------------+-------------+----------+--------------+  Left Cephalic    Diameter (cm)Depth (cm)   Findings     +-----------------+-------------+----------+--------------+  Shoulder                               not visualized  +-----------------+-------------+----------+--------------+  Prox upper arm                          not visualized  +-----------------+-------------+----------+--------------+  Mid upper arm                           not visualized  +-----------------+-------------+----------+--------------+  Dist upper arm                          not visualized  +-----------------+-------------+----------+--------------+  Antecubital fossa                       not visualized  +-----------------+-------------+----------+--------------+  Prox forearm          0.23                               +-----------------+-------------+----------+--------------+  Mid forearm          0.23                               +-----------------+-------------+----------+--------------+  Dist forearm         0.17                               +-----------------+-------------+----------+--------------+  Wrist                                  not visualized  +-----------------+-------------+----------+--------------+   +-----------------+-------------+----------+--------------+  Left Basilic     Diameter (cm)Depth (cm)   Findings     +-----------------+-------------+----------+--------------+  Shoulder  not visualized  +-----------------+-------------+----------+--------------+  Prox upper arm       0.81                               +-----------------+-------------+----------+--------------+  Mid upper arm        0.33                               +-----------------+-------------+----------+--------------+  Dist upper arm       0.39                               +-----------------+-------------+----------+--------------+  Antecubital fossa    0.34                               +-----------------+-------------+----------+--------------+  Prox forearm         0.30                               +-----------------+-------------+----------+--------------+   Summary:  : Measurements above.   *See table(s) above for measurements and observations.      Diagnosing physician: Lonni Gaskins MD  Electronically signed by Lonni Gaskins MD on 11/16/2024 at 3:36:28 PM.        Final     Assessment/Plan:  53 y.o. male, with history of hypertension and CKD 4 now with GFR near 15 with declining kidney function that presents for evaluation of permanent dialysis access.  I discussed access placement in his nondominant arm which would be his left arm.  Vein mapping today does show a  usable basilic vein which I discussed would likely require 2 stages.  I discussed if no usable vein would require AV graft.  I discussed the fistula taking on average 3 months to mature versus graft taking 1 months to mature.  Risk benefits discussed including infection, failure to mature, etc.  Patient would like to further think about options and will call back to schedule if he elects to proceed and would also like to further discuss with his nephrologist Dr. Baruch.  We are available as needed.  When he contacts us  we will schedule left arm AV fistula versus graft.   Lonni DOROTHA Gaskins, MD [facility]: [phone]        [1] No Known Allergies  "
